# Patient Record
Sex: Female | Born: 1985 | Race: White | Hispanic: No | Marital: Single
Health system: Midwestern US, Academic
[De-identification: ages and names within clinical notes are randomized; demographics above are authoritative.]

---

## 2018-11-27 ENCOUNTER — Ambulatory Visit: Admit: 2018-11-27 | Discharge: 2018-11-27 | Payer: Commercial Managed Care - HMO

## 2018-11-27 ENCOUNTER — Encounter: Admit: 2018-11-27 | Discharge: 2018-11-27 | Payer: Commercial Managed Care - HMO

## 2018-11-27 DIAGNOSIS — F329 Major depressive disorder, single episode, unspecified: Principal | ICD-10-CM

## 2018-11-27 DIAGNOSIS — IMO0002 Unspecified mental or behavioral problem: Secondary | ICD-10-CM

## 2018-11-27 DIAGNOSIS — T7840XA Allergy, unspecified, initial encounter: ICD-10-CM

## 2018-11-27 DIAGNOSIS — R634 Abnormal weight loss: Principal | ICD-10-CM

## 2018-11-27 DIAGNOSIS — G43909 Migraine, unspecified, not intractable, without status migrainosus: ICD-10-CM

## 2018-11-27 DIAGNOSIS — F419 Anxiety disorder, unspecified: ICD-10-CM

## 2018-11-27 DIAGNOSIS — N879 Dysplasia of cervix uteri, unspecified: ICD-10-CM

## 2018-11-27 DIAGNOSIS — D509 Iron deficiency anemia, unspecified: Secondary | ICD-10-CM

## 2018-11-27 DIAGNOSIS — D649 Anemia, unspecified: ICD-10-CM

## 2018-11-27 DIAGNOSIS — Z7689 Persons encountering health services in other specified circumstances: ICD-10-CM

## 2018-11-27 DIAGNOSIS — G47419 Narcolepsy without cataplexy: ICD-10-CM

## 2018-11-29 ENCOUNTER — Encounter: Admit: 2018-11-29 | Discharge: 2018-11-29 | Payer: Commercial Managed Care - HMO

## 2018-11-29 DIAGNOSIS — F419 Anxiety disorder, unspecified: ICD-10-CM

## 2018-11-29 DIAGNOSIS — N879 Dysplasia of cervix uteri, unspecified: ICD-10-CM

## 2018-11-29 DIAGNOSIS — G43909 Migraine, unspecified, not intractable, without status migrainosus: ICD-10-CM

## 2018-11-29 DIAGNOSIS — G47419 Narcolepsy without cataplexy: ICD-10-CM

## 2018-11-29 DIAGNOSIS — D649 Anemia, unspecified: ICD-10-CM

## 2018-11-29 DIAGNOSIS — F329 Major depressive disorder, single episode, unspecified: Principal | ICD-10-CM

## 2018-11-29 DIAGNOSIS — IMO0002 Gave birth to child recently: Secondary | ICD-10-CM

## 2018-11-29 DIAGNOSIS — T7840XA Allergy, unspecified, initial encounter: ICD-10-CM

## 2018-11-29 NOTE — Telephone Encounter
Per Dr. Gerhard Munch request, attempted to reach patient to discuss options for another appointment. LVM in regards to following up with Dr. Gerhard Munch about her concerns, or with her original PCP in [place]. Left nurse number in voicemail. Will send letter as well. Susa Raring, RN

## 2018-12-06 ENCOUNTER — Encounter: Admit: 2018-12-06 | Discharge: 2018-12-06 | Payer: Commercial Managed Care - HMO

## 2018-12-07 ENCOUNTER — Encounter: Admit: 2018-12-07 | Discharge: 2018-12-07 | Payer: Commercial Managed Care - HMO

## 2018-12-11 ENCOUNTER — Encounter: Admit: 2018-12-11 | Discharge: 2018-12-11 | Payer: Commercial Managed Care - HMO

## 2019-01-09 ENCOUNTER — Encounter: Admit: 2019-01-09 | Discharge: 2019-01-09

## 2019-01-16 ENCOUNTER — Encounter: Admit: 2019-01-16 | Discharge: 2019-01-16

## 2019-05-03 ENCOUNTER — Encounter: Admit: 2019-05-03 | Discharge: 2019-05-03 | Payer: Commercial Managed Care - HMO

## 2019-07-13 ENCOUNTER — Encounter: Admit: 2019-07-13 | Discharge: 2019-07-13 | Payer: Commercial Managed Care - HMO

## 2019-10-04 ENCOUNTER — Encounter: Admit: 2019-10-04 | Discharge: 2019-10-04 | Payer: Commercial Managed Care - HMO

## 2020-02-26 IMAGING — CR CHEST
1 series · 1 of 1 positions shown · non-contrast
Comparison: none

[chest port x-wise]
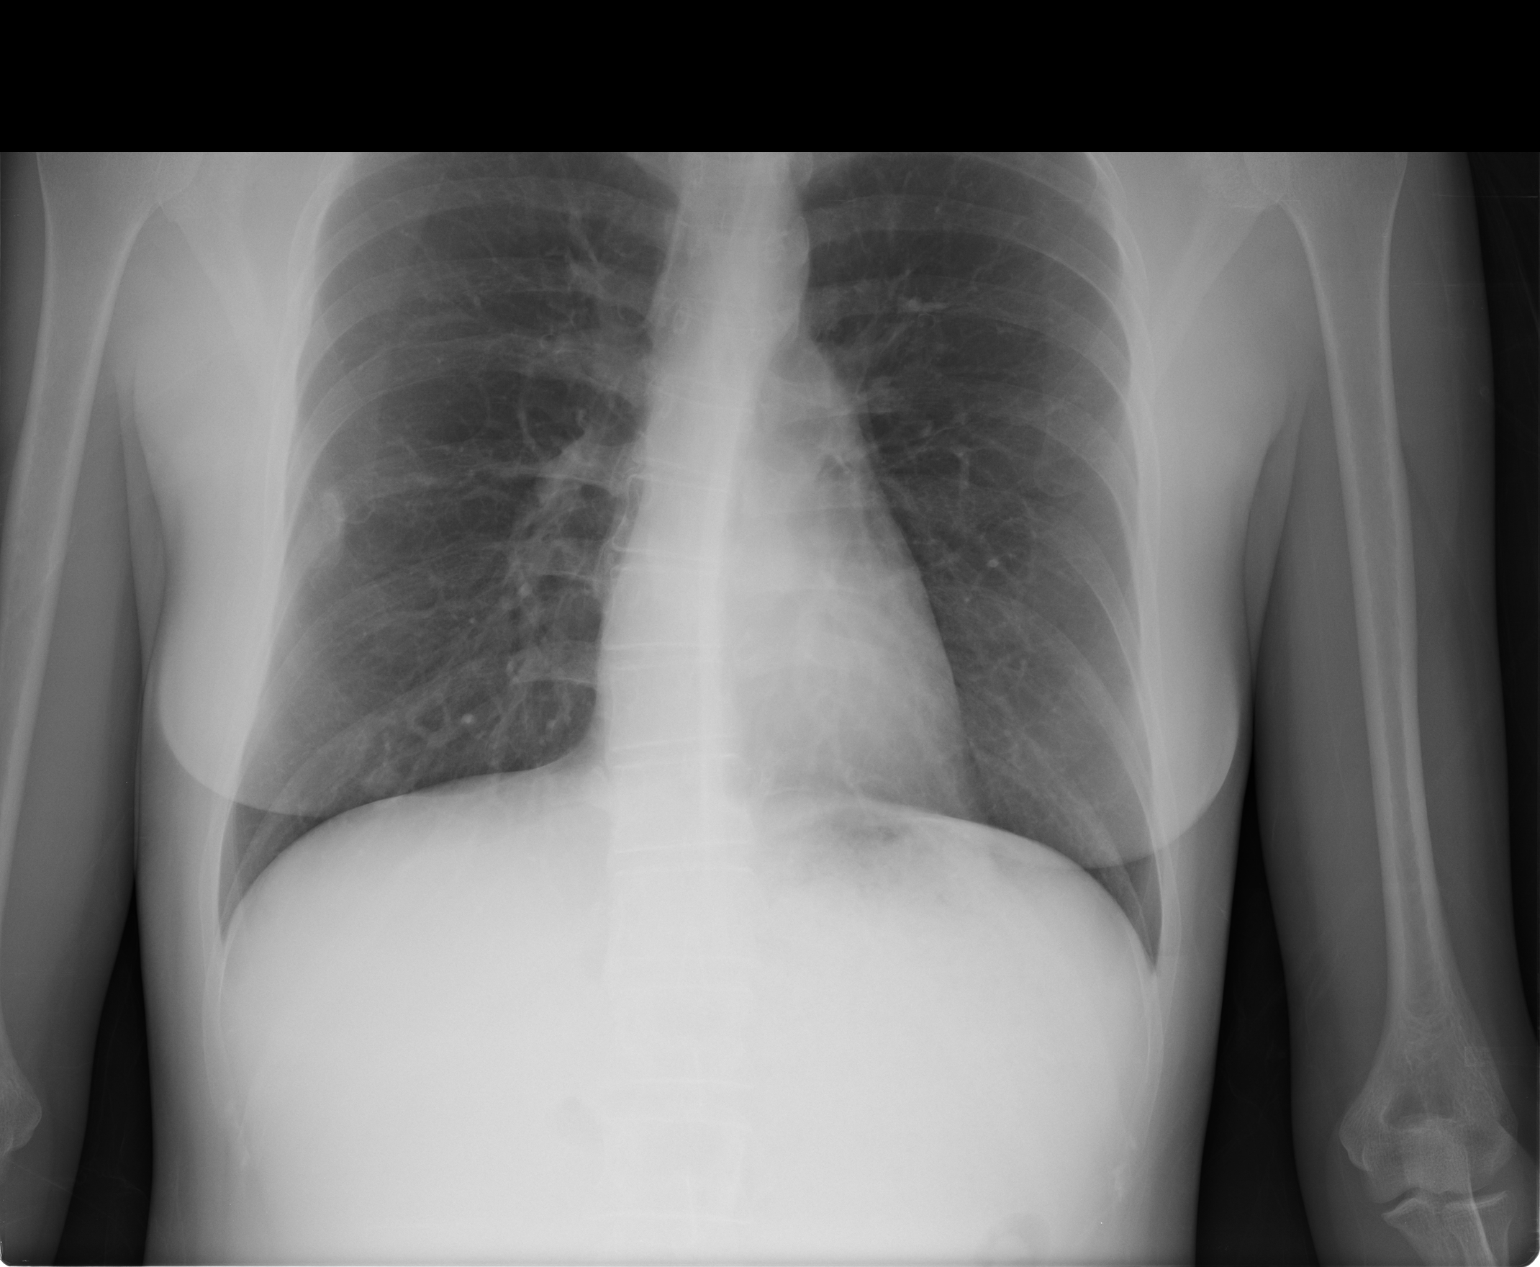

[1 of 1 positions shown; findings below may reference images not displayed]

EXAM

RADIOLOGICAL EXAMINATION, CHEST; SINGLE VIEW, FRONTAL CPT 91111

INDICATION

pt concerned about lungs  cough
NO ADDITIONAL IMAGES AVAILABLE AT THIS FACILITY. DENIES PREGNANCY. SHIELDED. HB

TECHNIQUE

1 view of the chest was acquired.

COMPARISONS

There are no previous examinations available for comparison at the time of dictation.

FINDINGS

Single AP portable view of the chest shows no focal consolidation, effusions, or pulmonary edema.
Cardiac and mediastinal contours are unremarkable. Old healed fracture of the 8th rib posteriorly on
the right side is noted

IMPRESSION

No acute cardiopulmonary process.

Tech Notes:

NO ADDITIONAL IMAGES AVAILABLE AT THIS FACILITY. DENIES PREGNANCY. SHIELDED. HB

## 2020-04-14 ENCOUNTER — Encounter: Admit: 2020-04-14 | Discharge: 2020-04-14 | Payer: Commercial Managed Care - HMO

## 2020-11-25 ENCOUNTER — Encounter: Admit: 2020-11-25 | Discharge: 2020-11-25 | Payer: Commercial Managed Care - HMO
